# Patient Record
Sex: Female | Born: 1965 | Race: White | Hispanic: Yes | Marital: Married | Smoking: Never smoker
Health system: Southern US, Community
[De-identification: ages and names within clinical notes are randomized; demographics above are authoritative.]

---

## 1998-11-29 HISTORY — PX: BUNIONECTOMY: SHX129

## 2002-03-23 ENCOUNTER — Ambulatory Visit (HOSPITAL_COMMUNITY): Admission: RE | Admit: 2002-03-23 | Discharge: 2002-03-23 | Payer: Self-pay | Admitting: Family Medicine

## 2002-03-23 ENCOUNTER — Encounter: Payer: Self-pay | Admitting: Family Medicine

## 2003-05-09 ENCOUNTER — Encounter: Admission: RE | Admit: 2003-05-09 | Discharge: 2003-05-09 | Payer: Self-pay | Admitting: Family Medicine

## 2003-05-09 ENCOUNTER — Encounter: Payer: Self-pay | Admitting: Family Medicine

## 2003-12-08 ENCOUNTER — Emergency Department (HOSPITAL_COMMUNITY): Admission: EM | Admit: 2003-12-08 | Discharge: 2003-12-08 | Payer: Self-pay | Admitting: Emergency Medicine

## 2003-12-08 IMAGING — US US ABDOMEN COMPLETE
1 series · 14 of 25 positions shown · non-contrast
Comparison: none

CLINICAL DATA: Abdominal pain.
 COMPLETE ABDOMINAL ULTRASOUND
 There is no evidence of gallstones or gallbladder wall thickening.  There is no evidence of biliary ductal dilatation.  The liver is within normal limits in echogenicity, and no focal parenchymal lesions are identified.  The visualized portion of the pancreas is unremarkable in appearance.  Common bile duct 2.6 mm.  
 The kidneys are within normal limits in size and echogenicity, and there is no evidence of masses or hydronephrosis. Right kidney 10.3 cm.  Left kidney 10.3 cm.    There is no evidence of splenomegaly, ascites, or abdominal aortic aneurysm.  
 IMPRESSION
 Negative abdominal ultrasound.

[Series 1: us abdomen · 0.27mm/px · 14 of 68 slices shown]
[im 1/68]
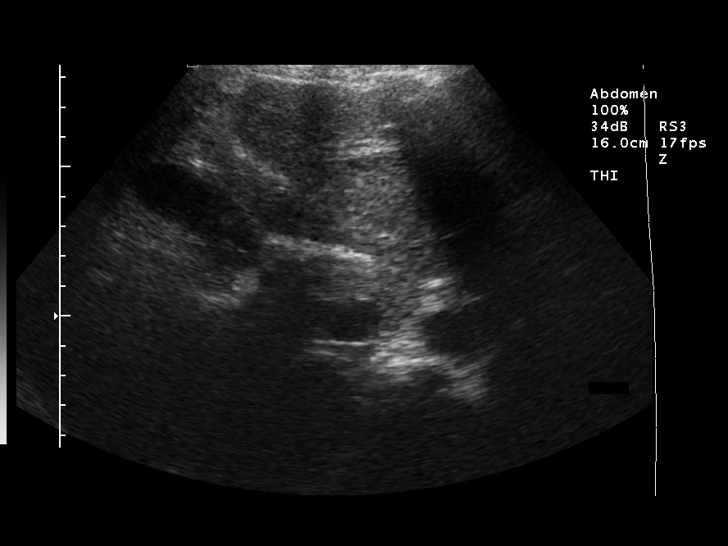
[im 6/68]
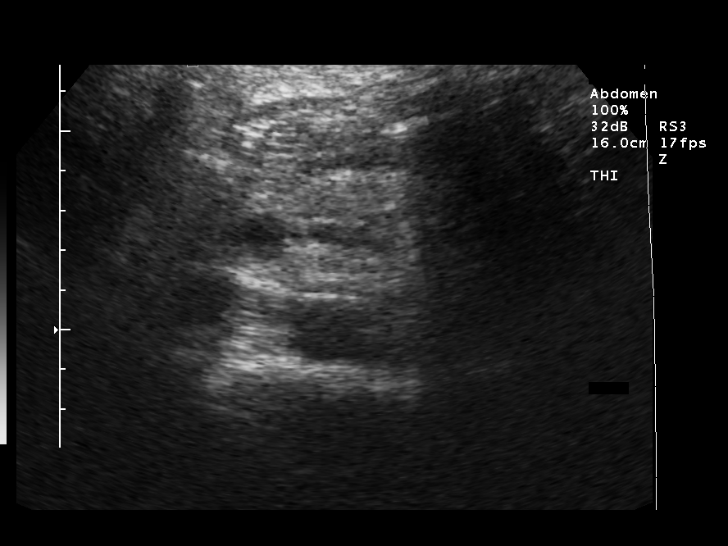
[im 12/68]
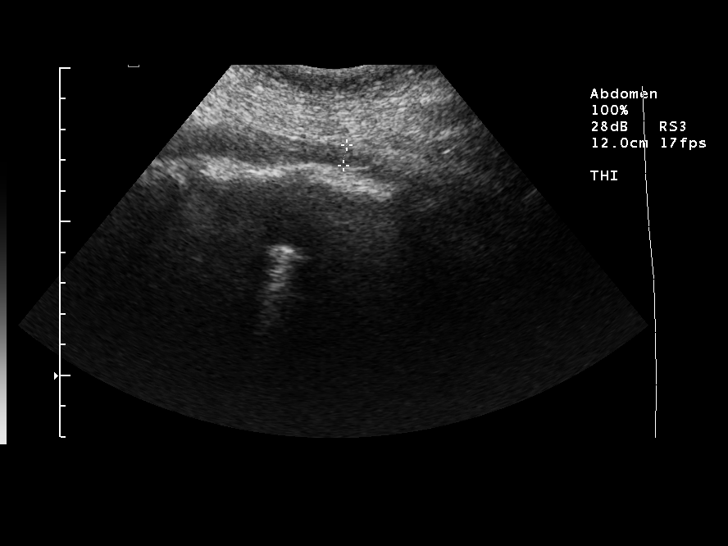
[im 17/68]
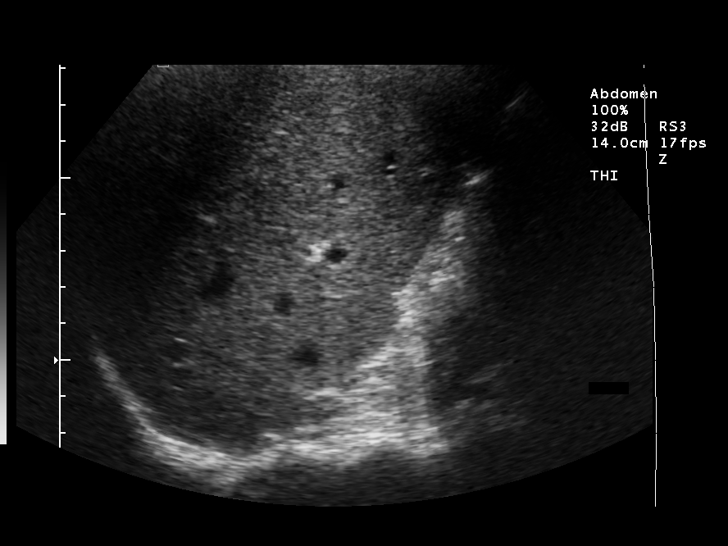
[im 23/68]
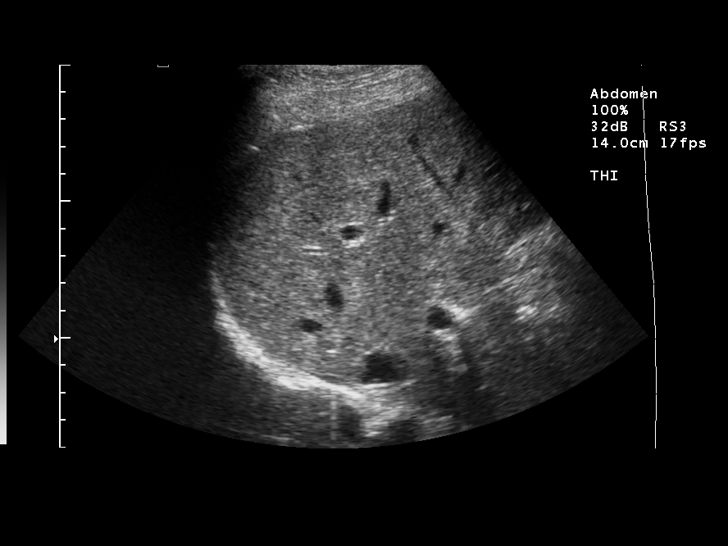
[im 26/68]
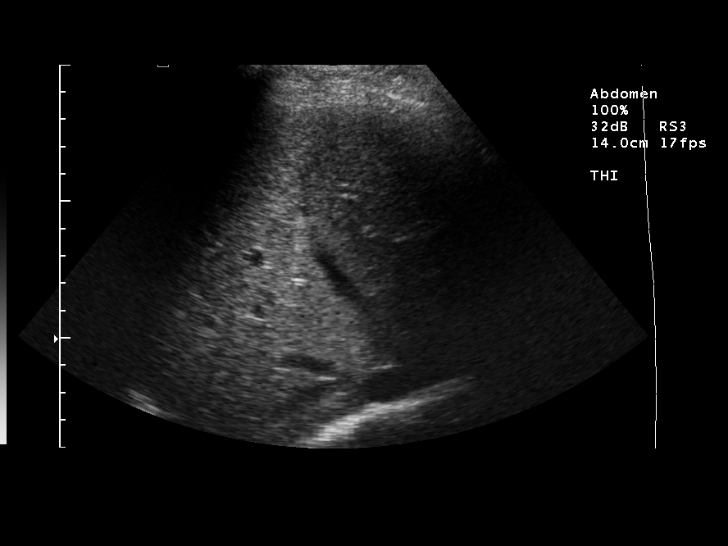
[im 31/68]
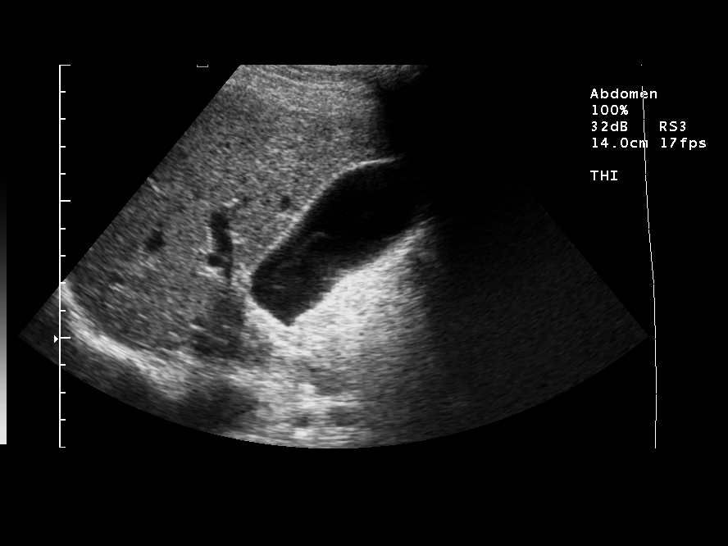
[im 37/68]
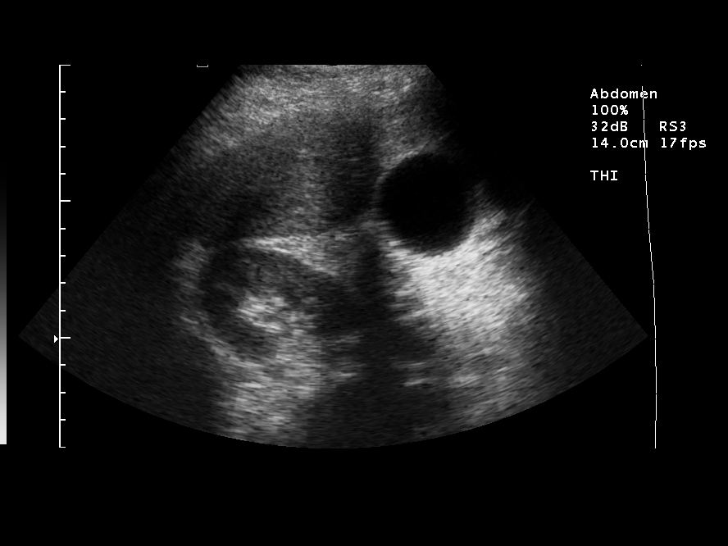
[im 42/68]
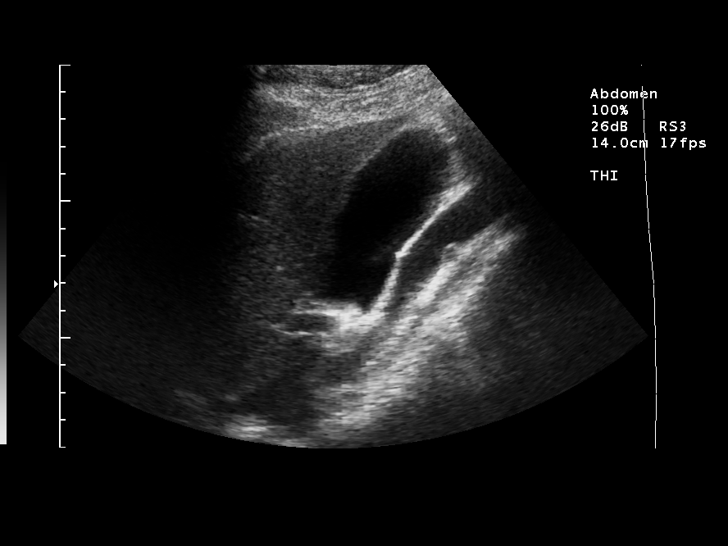
[im 45/68]
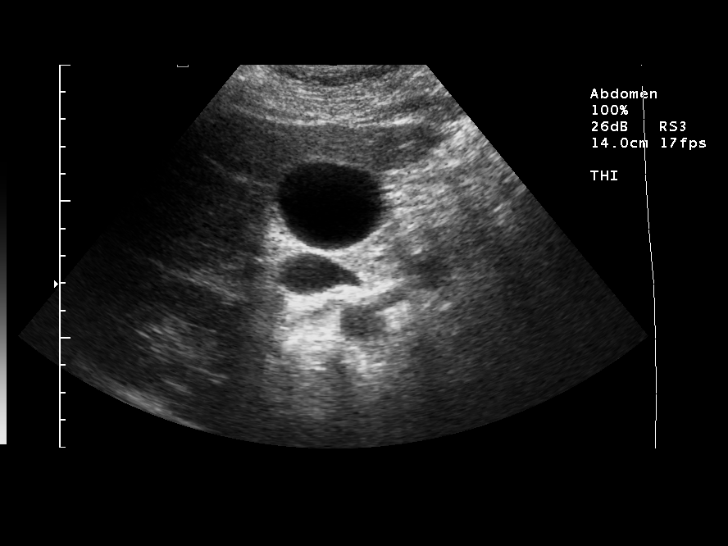
[im 51/68]
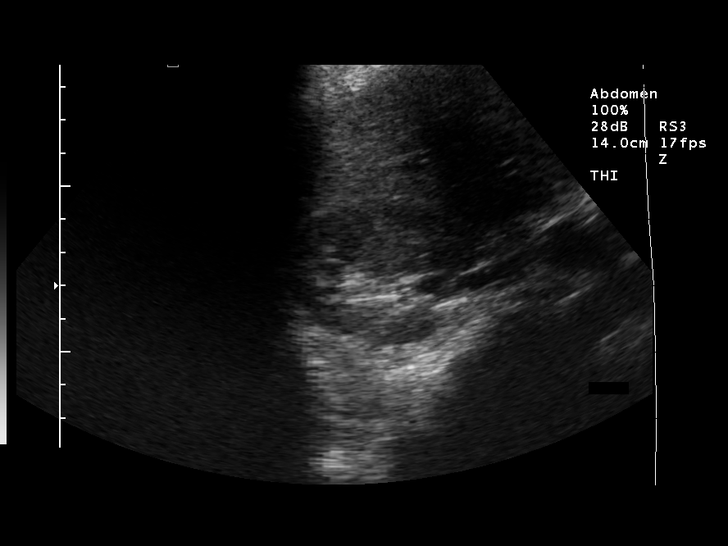
[im 56/68]
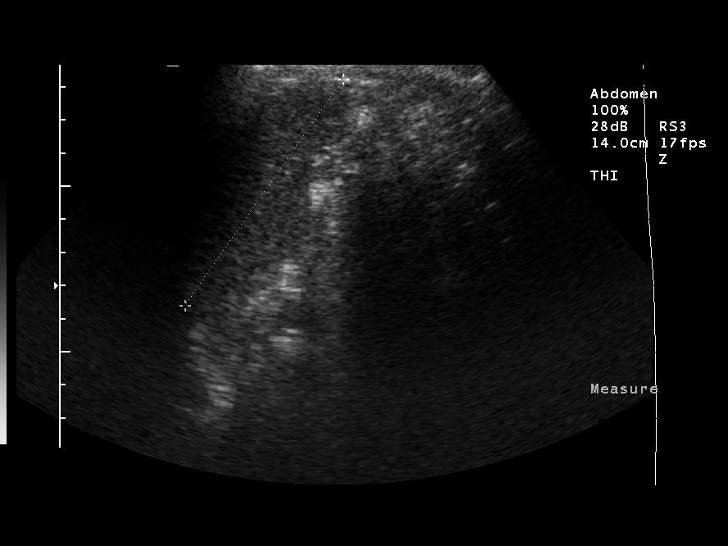
[im 62/68]
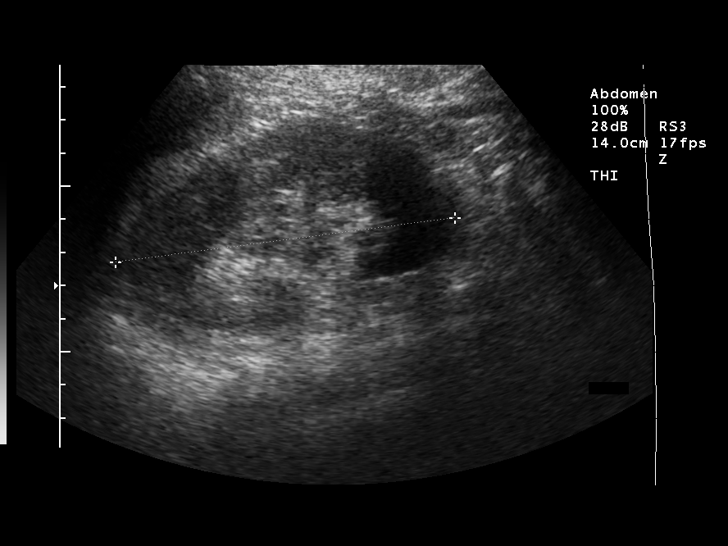
[im 68/68]
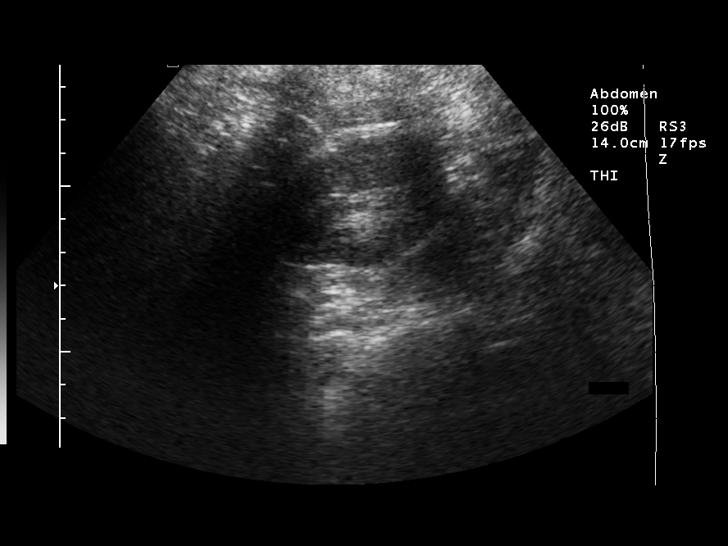

[14 of 25 positions shown; findings below may reference images not displayed]

## 2004-09-15 ENCOUNTER — Other Ambulatory Visit: Admission: RE | Admit: 2004-09-15 | Discharge: 2004-09-15 | Payer: Self-pay | Admitting: Family Medicine

## 2005-09-03 ENCOUNTER — Encounter: Admission: RE | Admit: 2005-09-03 | Discharge: 2005-09-03 | Payer: Self-pay | Admitting: Family Medicine

## 2005-09-03 IMAGING — CR DG SKULL COMPLETE 4+V
4 series · 4 of 4 positions shown · non-contrast
Comparison: None.

CLINICAL DATA: Pain in the right parietal area.  No history of injury.
 SKULL ? 4 VIEWS:

[[person_name]]
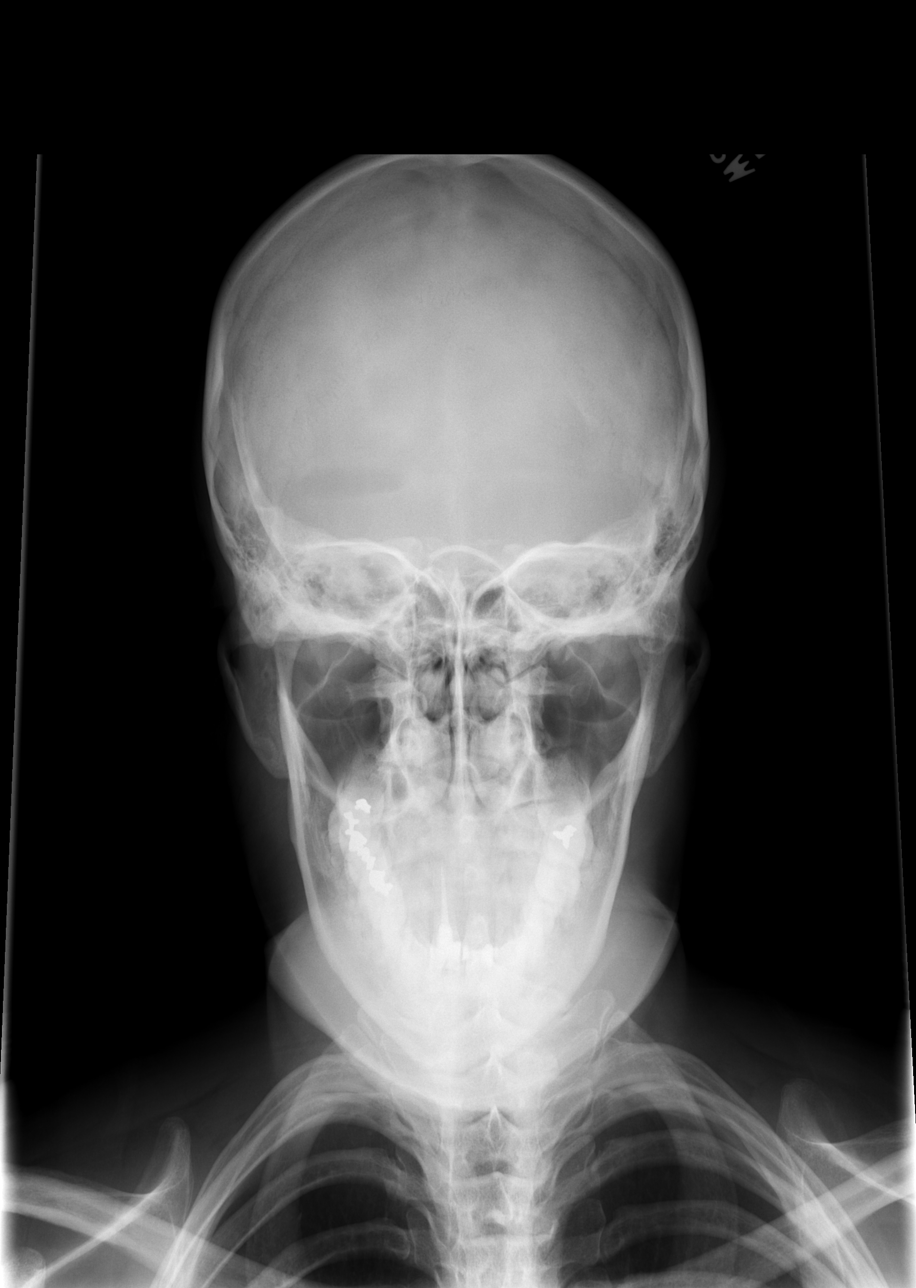

[w skull a.p./p.a.]
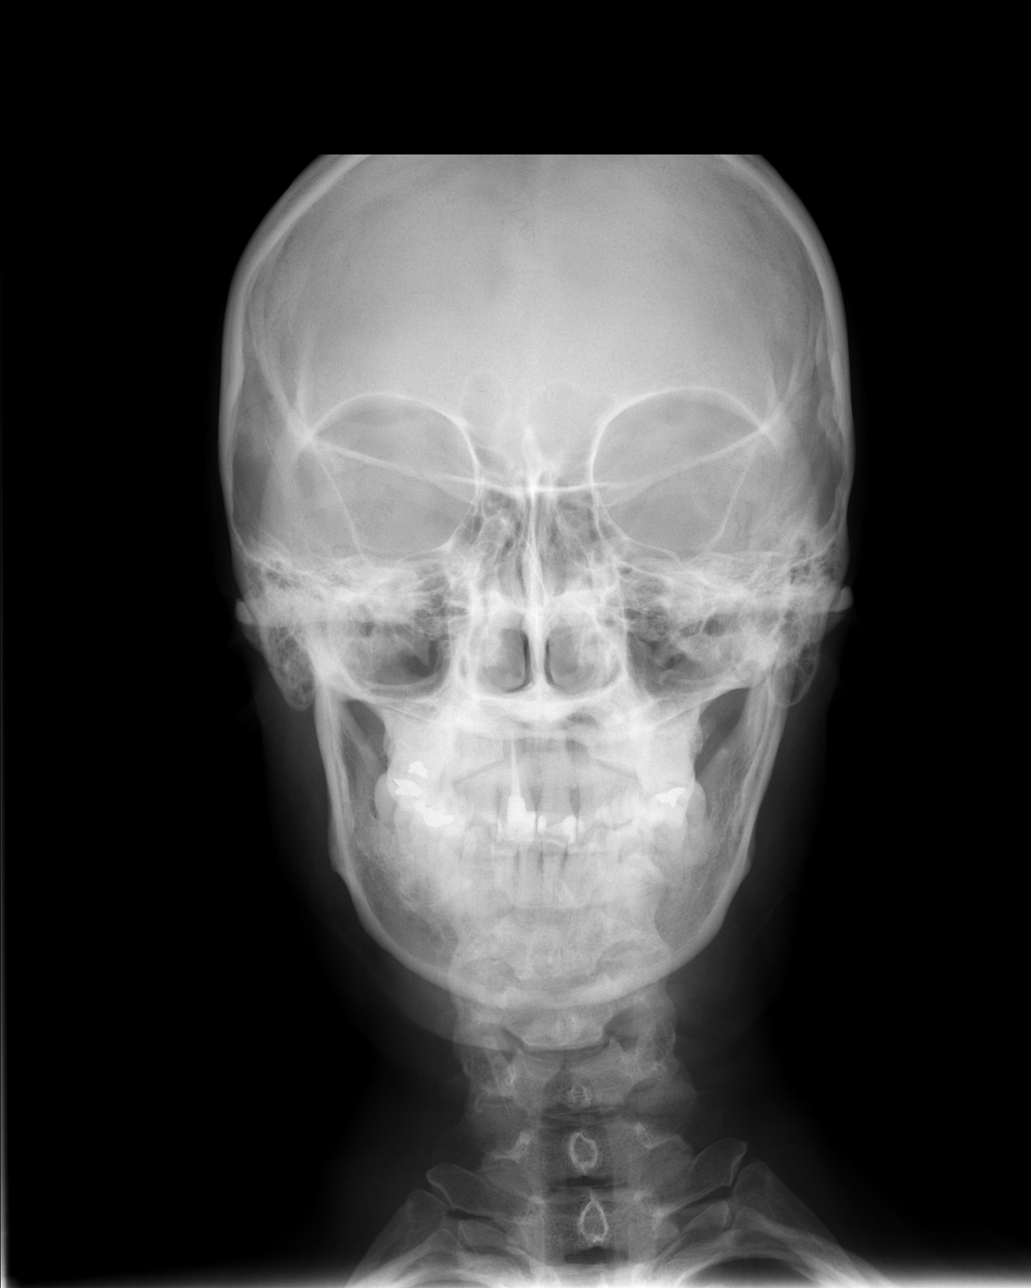

[w skull lat (1 of 2)]
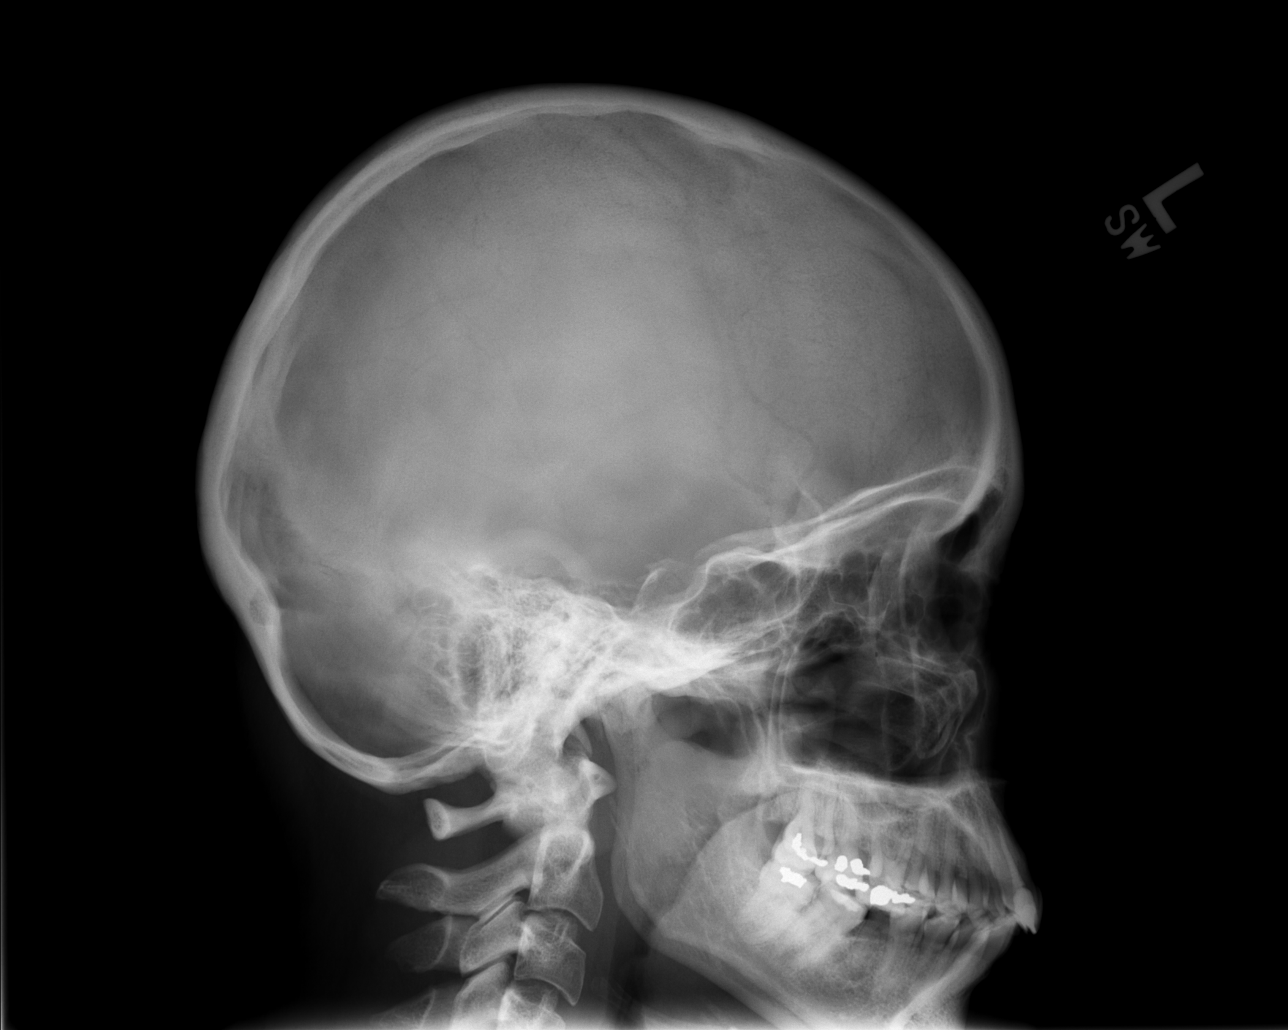

[w skull lat (2 of 2)]
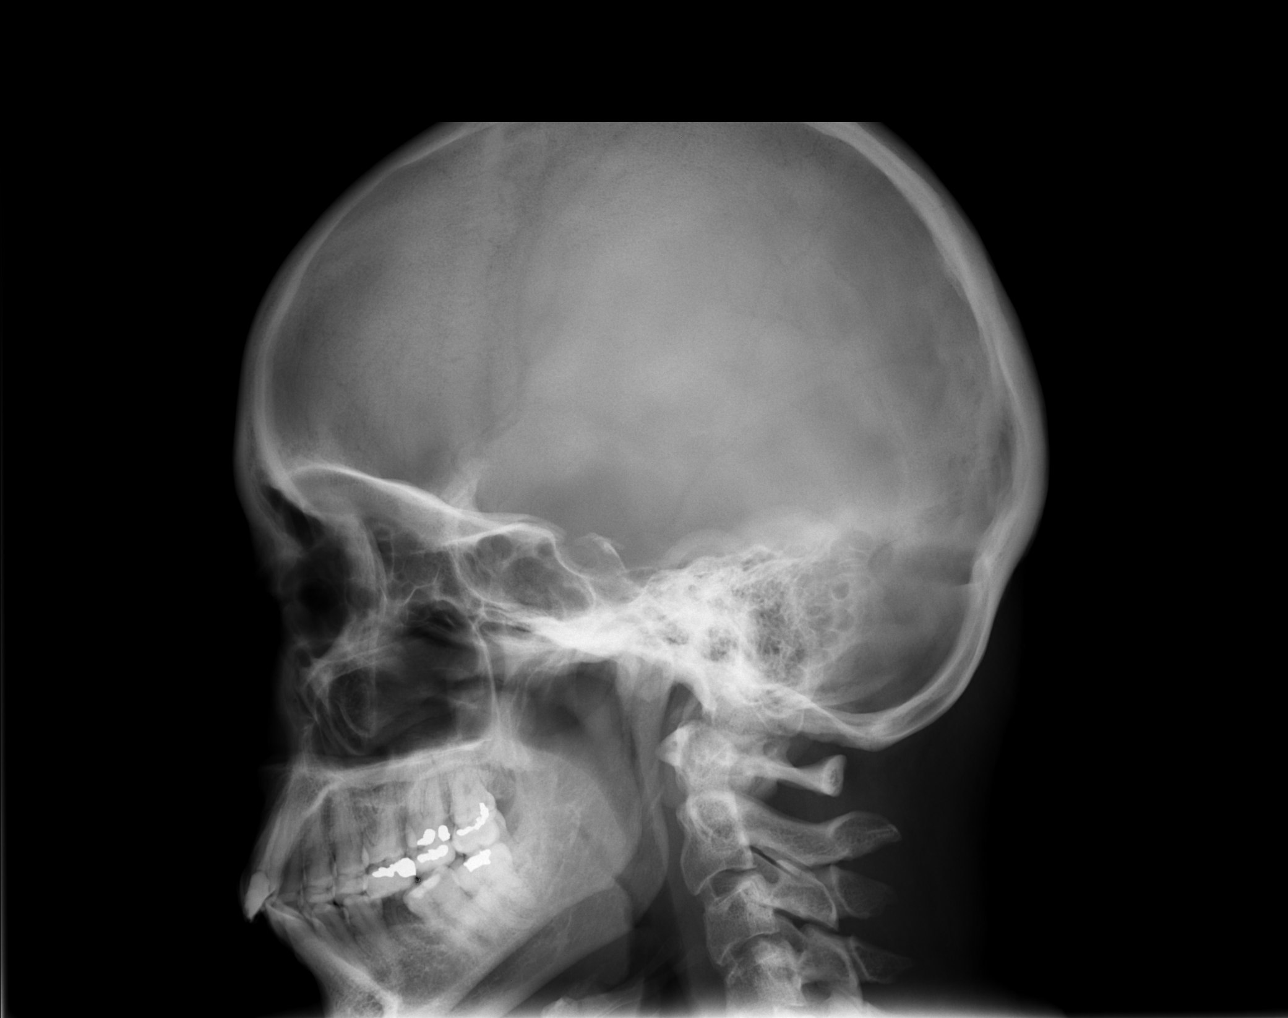

[4 of 4 positions shown; findings below may reference images not displayed]

FINDINGS: No calvarial defects are identified.  The paranasal sinuses appear to be normally aerated.
IMPRESSION: 1.  Normal skull.
 2.  If the patient?s clinical symptoms do not improve a CT or MRI may be considered for more definitive evaluation.

## 2006-09-16 ENCOUNTER — Other Ambulatory Visit: Admission: RE | Admit: 2006-09-16 | Discharge: 2006-09-16 | Payer: Self-pay | Admitting: Family Medicine

## 2006-10-07 ENCOUNTER — Ambulatory Visit (HOSPITAL_COMMUNITY): Admission: RE | Admit: 2006-10-07 | Discharge: 2006-10-07 | Payer: Self-pay | Admitting: Family Medicine

## 2013-02-16 ENCOUNTER — Other Ambulatory Visit: Payer: Self-pay | Admitting: Family Medicine

## 2013-02-16 ENCOUNTER — Other Ambulatory Visit (HOSPITAL_COMMUNITY)
Admission: RE | Admit: 2013-02-16 | Discharge: 2013-02-16 | Disposition: A | Discharge status: Home / Self Care | Payer: Managed Care, Other (non HMO) | Source: Ambulatory Visit | Attending: Family Medicine | Admitting: Family Medicine

## 2013-02-16 DIAGNOSIS — Z124 Encounter for screening for malignant neoplasm of cervix: Secondary | ICD-10-CM | POA: Insufficient documentation

## 2016-05-27 ENCOUNTER — Other Ambulatory Visit: Payer: Self-pay | Admitting: Family Medicine

## 2016-05-27 ENCOUNTER — Other Ambulatory Visit (HOSPITAL_COMMUNITY)
Admission: RE | Admit: 2016-05-27 | Discharge: 2016-05-27 | Disposition: A | Discharge status: Home / Self Care | Payer: Managed Care, Other (non HMO) | Source: Ambulatory Visit | Attending: Family Medicine | Admitting: Family Medicine

## 2016-05-27 DIAGNOSIS — Z124 Encounter for screening for malignant neoplasm of cervix: Secondary | ICD-10-CM | POA: Diagnosis present

## 2016-05-31 LAB — CYTOLOGY - PAP

## 2018-05-26 ENCOUNTER — Other Ambulatory Visit: Payer: Self-pay | Admitting: Family Medicine

## 2018-05-26 DIAGNOSIS — R300 Dysuria: Secondary | ICD-10-CM | POA: Diagnosis not present

## 2018-05-26 DIAGNOSIS — R198 Other specified symptoms and signs involving the digestive system and abdomen: Secondary | ICD-10-CM

## 2018-05-26 DIAGNOSIS — N644 Mastodynia: Secondary | ICD-10-CM | POA: Diagnosis not present

## 2018-06-02 ENCOUNTER — Ambulatory Visit
Admission: RE | Admit: 2018-06-02 | Discharge: 2018-06-02 | Disposition: A | Discharge status: Home / Self Care | Payer: BLUE CROSS/BLUE SHIELD | Source: Ambulatory Visit | Attending: Family Medicine | Admitting: Family Medicine

## 2018-06-02 DIAGNOSIS — R198 Other specified symptoms and signs involving the digestive system and abdomen: Secondary | ICD-10-CM

## 2018-06-02 DIAGNOSIS — K760 Fatty (change of) liver, not elsewhere classified: Secondary | ICD-10-CM | POA: Diagnosis not present

## 2018-06-02 IMAGING — CT CT ABD-PELV W/ CM
1 of 3 series · 13 of 32 positions shown, 19 images · IV contrast (iopamidol)
Comparison: None.

CLINICAL DATA: Umbilical discharge with menstrual cycle for several
months.

EXAM:
CT ABDOMEN AND PELVIS WITH CONTRAST
TECHNIQUE: Multidetector CT imaging of the abdomen and pelvis was performed
using the standard protocol following bolus administration of
intravenous contrast.
CONTRAST:  100mL [id number] IOPAMIDOL ([id number]) INJECTION 61%

[Series 2: abd/pelvis w/cm · axial · 0.77mm/px · z∈[-427,-7]mm · 13 of 98 slices shown, 19 images]
[im 7/98  soft-tissue]
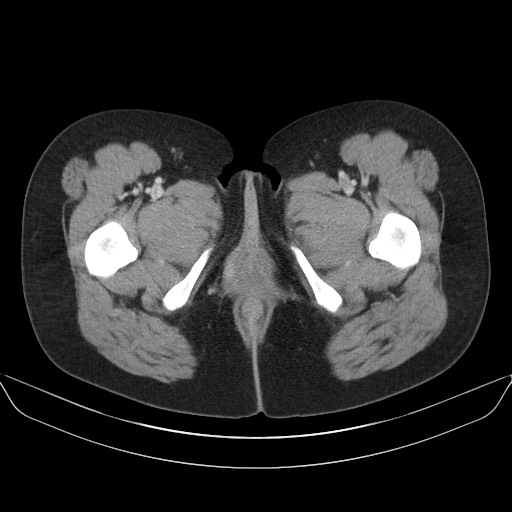
[im 7/98  bone]
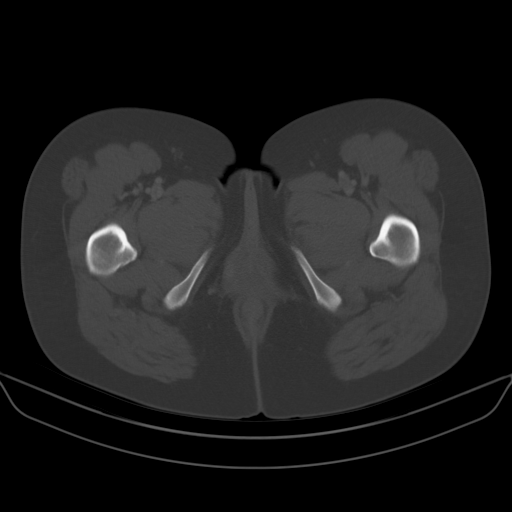
[im 14/98  soft-tissue]
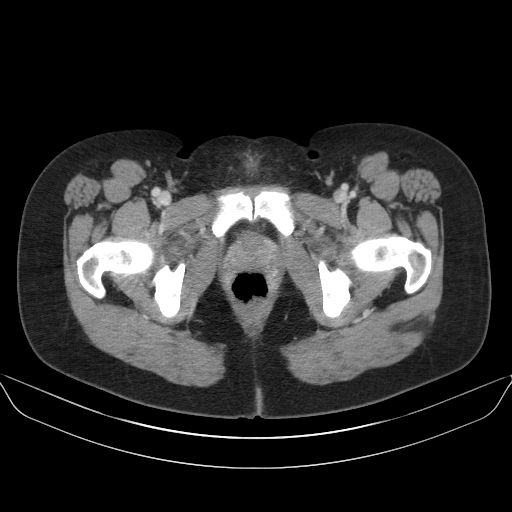
[im 21/98  soft-tissue]
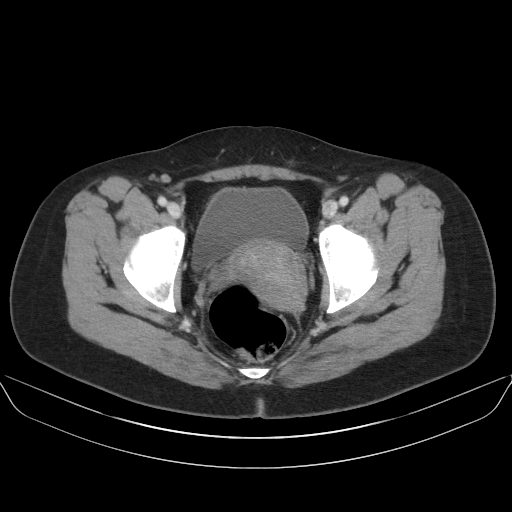
[im 28/98  soft-tissue]
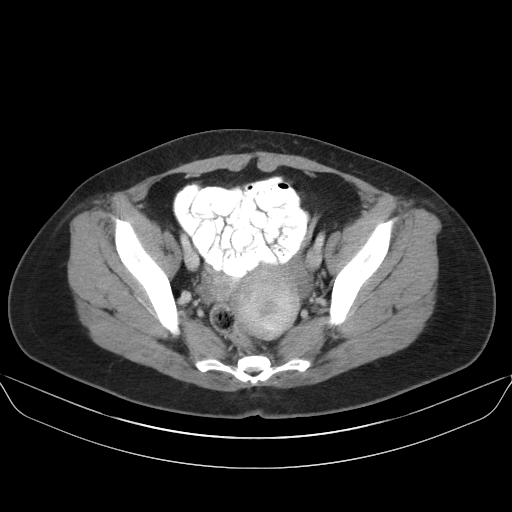
[im 35/98  soft-tissue]
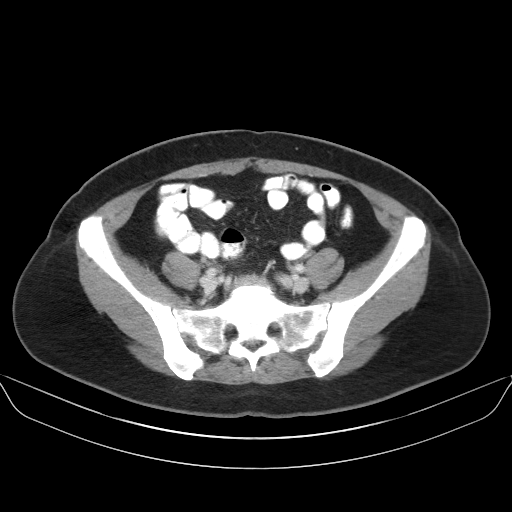
[im 42/98  soft-tissue]
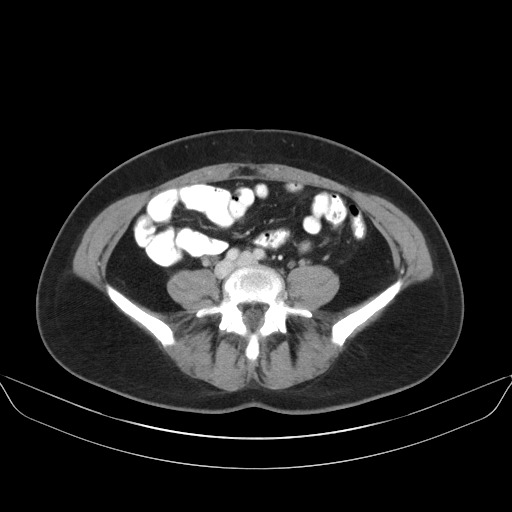
[im 49/98  soft-tissue]
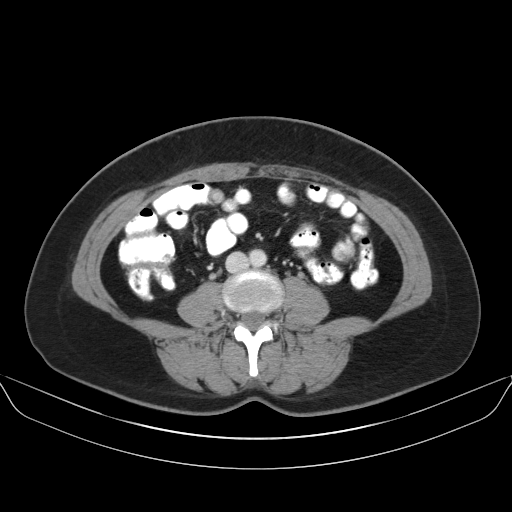
[im 56/98  soft-tissue]
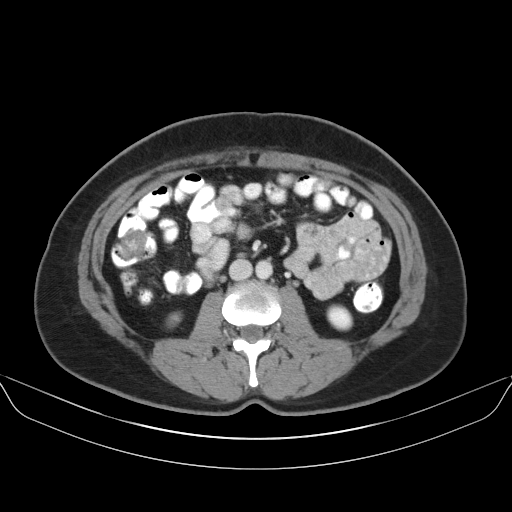
[im 63/98  soft-tissue]
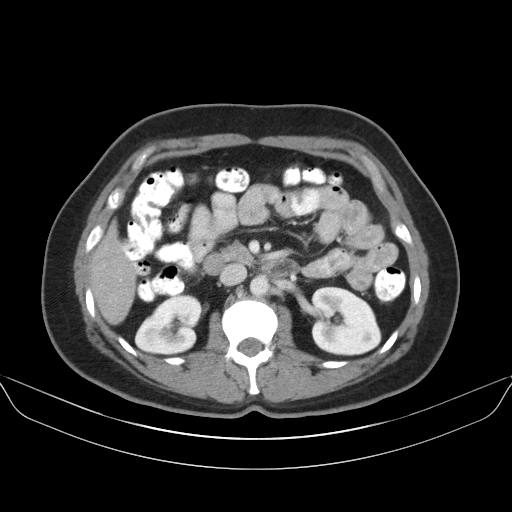
[im 63/98  bone]
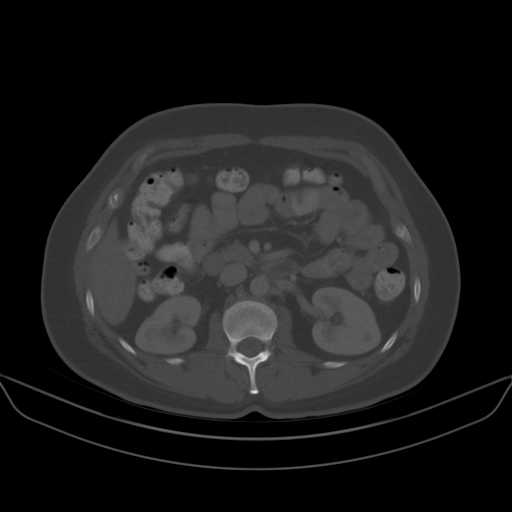
[im 70/98  soft-tissue]
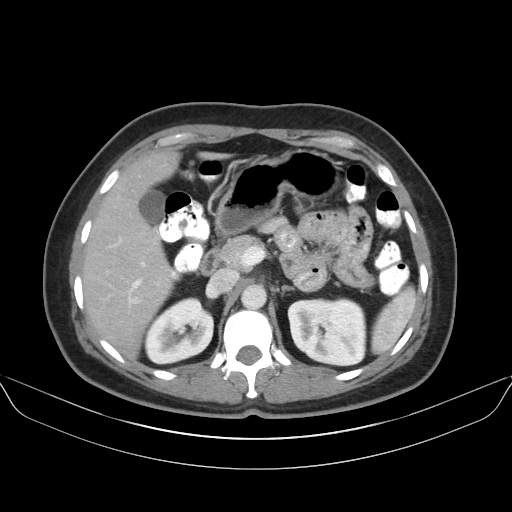
[im 70/98  lung]
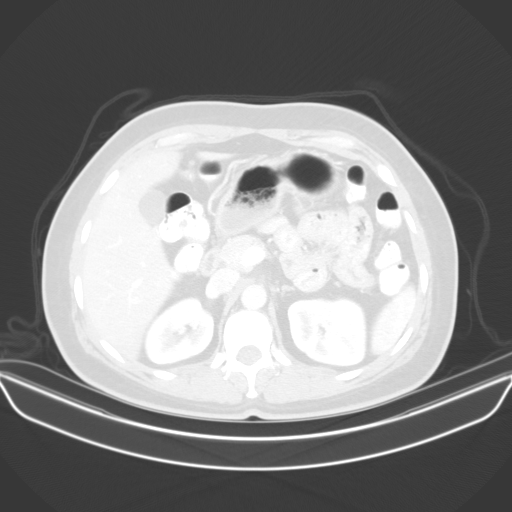
[im 77/98  soft-tissue]
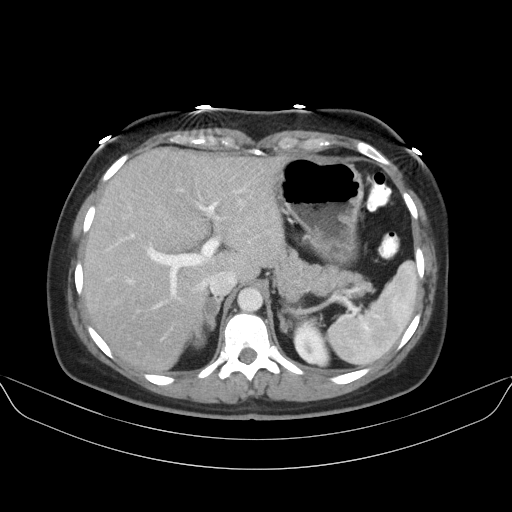
[im 77/98  lung]
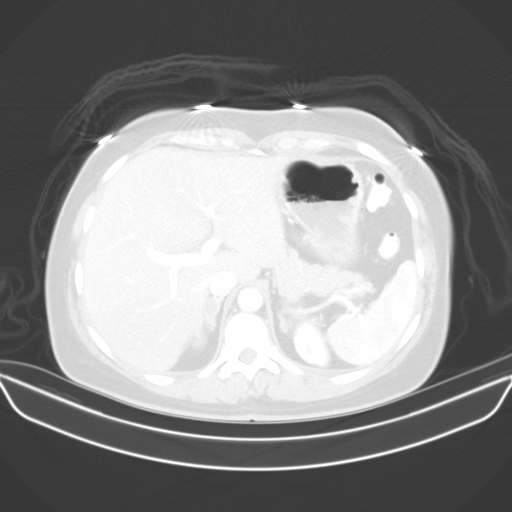
[im 84/98  soft-tissue]
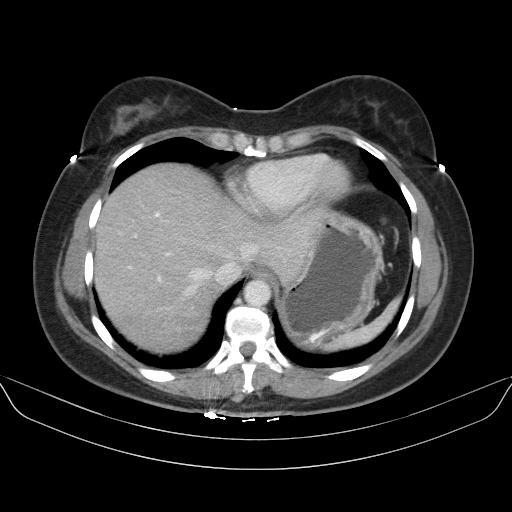
[im 84/98  lung]
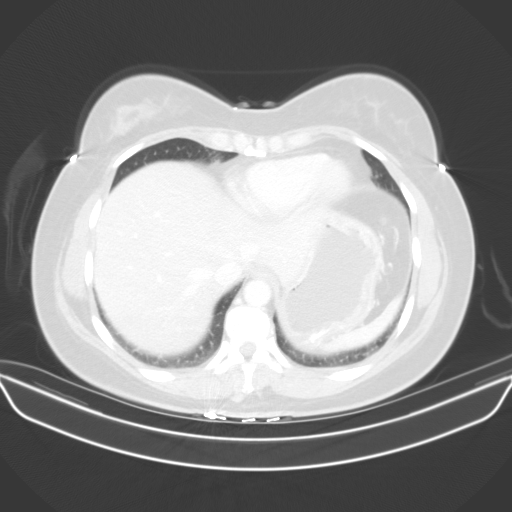
[im 91/98  soft-tissue]
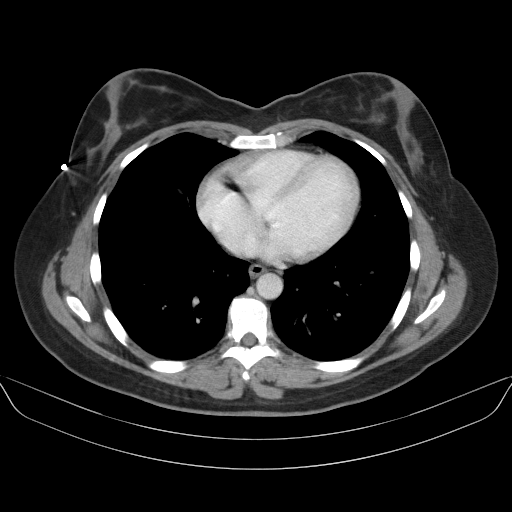
[im 91/98  lung]
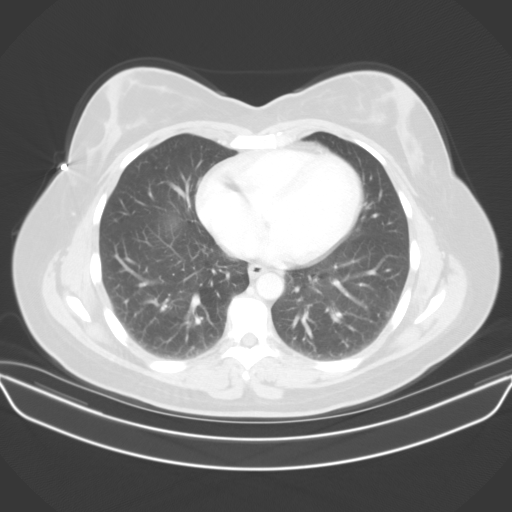

[13 of 32 positions shown; findings below may reference images not displayed]

FINDINGS: Lower chest: No acute abnormality.

Hepatobiliary: Hepatic steatosis. Portal vein and gallbladder are
normal.

Pancreas: Unremarkable. No pancreatic ductal dilatation or
surrounding inflammatory changes.

Spleen: Normal in size without focal abnormality.

Adrenals/Urinary Tract: The left adrenal gland is normal. A small
nodule is seen in the right adrenal gland measuring 13 by 10 mm. The
kidneys are normal. The ureters and bladder are normal.

Stomach/Bowel: The stomach and small bowel are normal. The colon is
normal. The appendix is normal.

Vascular/Lymphatic: No significant vascular findings are present. No
enlarged abdominal or pelvic lymph nodes.

Reproductive: Uterus and bilateral adnexa are unremarkable.

Other: There is mild increased attenuation in the fat of the
umbilicus. No fluid collection or fat stranding.

Musculoskeletal: No acute or significant osseous findings.
IMPRESSION: 1. There is mild increased attenuation in the fat of the umbilicus
with no adjacent fat stranding or fluid collection. This finding is
nonspecific but could represent scarring. No definitive cause for
the patient's symptoms identified.
2. Hepatic steatosis.
3. There is a small nodule in the right adrenal gland. This is
probably benign. Consider a 12 month follow-up CT scan to ensure
stability.

## 2018-06-02 MED ORDER — IOPAMIDOL (ISOVUE-300) INJECTION 61%
100.0000 mL | Freq: Once | INTRAVENOUS | Status: AC | PRN
  Administered 2018-06-02: 100 mL via INTRAVENOUS

## 2018-06-28 DIAGNOSIS — R1033 Periumbilical pain: Secondary | ICD-10-CM | POA: Diagnosis not present

## 2018-06-28 DIAGNOSIS — N644 Mastodynia: Secondary | ICD-10-CM | POA: Diagnosis not present

## 2018-07-06 DIAGNOSIS — N644 Mastodynia: Secondary | ICD-10-CM | POA: Diagnosis not present

## 2018-07-19 DIAGNOSIS — E7800 Pure hypercholesterolemia, unspecified: Secondary | ICD-10-CM | POA: Diagnosis not present

## 2018-07-19 DIAGNOSIS — Z0000 Encounter for general adult medical examination without abnormal findings: Secondary | ICD-10-CM | POA: Diagnosis not present

## 2018-08-22 DIAGNOSIS — Z23 Encounter for immunization: Secondary | ICD-10-CM | POA: Diagnosis not present

## 2019-07-13 DIAGNOSIS — Z1231 Encounter for screening mammogram for malignant neoplasm of breast: Secondary | ICD-10-CM | POA: Diagnosis not present

## 2019-08-31 ENCOUNTER — Other Ambulatory Visit: Payer: Self-pay | Admitting: Family Medicine

## 2019-08-31 ENCOUNTER — Other Ambulatory Visit (HOSPITAL_COMMUNITY)
Admission: RE | Admit: 2019-08-31 | Discharge: 2019-08-31 | Disposition: A | Discharge status: Home / Self Care | Payer: BC Managed Care – PPO | Source: Ambulatory Visit | Attending: Family Medicine | Admitting: Family Medicine

## 2019-08-31 DIAGNOSIS — Z01419 Encounter for gynecological examination (general) (routine) without abnormal findings: Secondary | ICD-10-CM | POA: Insufficient documentation

## 2019-08-31 DIAGNOSIS — Z0000 Encounter for general adult medical examination without abnormal findings: Secondary | ICD-10-CM | POA: Diagnosis not present

## 2019-08-31 DIAGNOSIS — E7800 Pure hypercholesterolemia, unspecified: Secondary | ICD-10-CM | POA: Diagnosis not present

## 2019-08-31 DIAGNOSIS — Z23 Encounter for immunization: Secondary | ICD-10-CM | POA: Diagnosis not present

## 2019-09-03 LAB — CYTOLOGY - PAP
Adequacy: ABSENT
Diagnosis: NEGATIVE

## 2019-09-06 DIAGNOSIS — Z1211 Encounter for screening for malignant neoplasm of colon: Secondary | ICD-10-CM | POA: Diagnosis not present

## 2019-09-13 ENCOUNTER — Other Ambulatory Visit: Payer: Self-pay

## 2019-09-13 DIAGNOSIS — Z20828 Contact with and (suspected) exposure to other viral communicable diseases: Secondary | ICD-10-CM | POA: Diagnosis not present

## 2019-09-13 DIAGNOSIS — Z20822 Contact with and (suspected) exposure to covid-19: Secondary | ICD-10-CM

## 2019-09-14 LAB — NOVEL CORONAVIRUS, NAA: SARS-CoV-2, NAA: NOT DETECTED

## 2019-11-30 DIAGNOSIS — Z780 Asymptomatic menopausal state: Secondary | ICD-10-CM

## 2019-11-30 HISTORY — DX: Asymptomatic menopausal state: Z78.0

## 2020-02-29 DIAGNOSIS — N3000 Acute cystitis without hematuria: Secondary | ICD-10-CM | POA: Diagnosis not present

## 2020-07-18 DIAGNOSIS — Z1231 Encounter for screening mammogram for malignant neoplasm of breast: Secondary | ICD-10-CM | POA: Diagnosis not present

## 2020-10-22 DIAGNOSIS — N760 Acute vaginitis: Secondary | ICD-10-CM | POA: Diagnosis not present

## 2020-10-22 DIAGNOSIS — Z23 Encounter for immunization: Secondary | ICD-10-CM | POA: Diagnosis not present

## 2020-10-22 DIAGNOSIS — Z0000 Encounter for general adult medical examination without abnormal findings: Secondary | ICD-10-CM | POA: Diagnosis not present

## 2020-10-22 DIAGNOSIS — E7800 Pure hypercholesterolemia, unspecified: Secondary | ICD-10-CM | POA: Diagnosis not present

## 2020-11-07 ENCOUNTER — Ambulatory Visit (INDEPENDENT_AMBULATORY_CARE_PROVIDER_SITE_OTHER): Payer: BC Managed Care – PPO

## 2020-11-07 ENCOUNTER — Other Ambulatory Visit: Payer: Self-pay | Admitting: Podiatry

## 2020-11-07 ENCOUNTER — Other Ambulatory Visit: Payer: Self-pay

## 2020-11-07 ENCOUNTER — Ambulatory Visit (INDEPENDENT_AMBULATORY_CARE_PROVIDER_SITE_OTHER): Payer: BC Managed Care – PPO | Admitting: Podiatry

## 2020-11-07 DIAGNOSIS — M79672 Pain in left foot: Secondary | ICD-10-CM

## 2020-11-07 DIAGNOSIS — R1033 Periumbilical pain: Secondary | ICD-10-CM | POA: Insufficient documentation

## 2020-11-07 DIAGNOSIS — N644 Mastodynia: Secondary | ICD-10-CM | POA: Insufficient documentation

## 2020-11-07 DIAGNOSIS — S99922A Unspecified injury of left foot, initial encounter: Secondary | ICD-10-CM | POA: Diagnosis not present

## 2020-11-07 DIAGNOSIS — M2041 Other hammer toe(s) (acquired), right foot: Secondary | ICD-10-CM

## 2020-11-07 DIAGNOSIS — M21961 Unspecified acquired deformity of right lower leg: Secondary | ICD-10-CM

## 2020-11-07 DIAGNOSIS — M21962 Unspecified acquired deformity of left lower leg: Secondary | ICD-10-CM

## 2020-11-07 DIAGNOSIS — S99921A Unspecified injury of right foot, initial encounter: Secondary | ICD-10-CM

## 2020-11-07 DIAGNOSIS — E7800 Pure hypercholesterolemia, unspecified: Secondary | ICD-10-CM | POA: Insufficient documentation

## 2020-11-07 DIAGNOSIS — M79671 Pain in right foot: Secondary | ICD-10-CM

## 2020-11-07 NOTE — Progress Notes (Signed)
  Subjective:  Patient ID: Elizabeth Sanchez, female    DOB: 07-Mar-1966,  MRN: [id number]  Chief Complaint  Patient presents with  . Toe Pain    Right 2nd digit painful 1 yr duration "I felt a pop and it has hurt since" pt states it is severely painful and worsening.    54 y.o. female presents with the above complaint. History confirmed with patient. Also has this to the left foot to a lesser extent. Has pain with all her shoes, and it has been hurting since she felt the pop.  Objective:  Physical Exam: warm, good capillary refill, no trophic changes or ulcerative lesions, normal DP and PT pulses and normal sensory exam. Left Foot: tenderness of the 2nd metatarsal head  Right Foot: tenderness of the 2nd metatarsal head, positive modified Lachman's test. Slight DF of 2nd toe. POP 3rd toe with hammertoe deformity.  No images are attached to the encounter.  Radiographs: X-ray of the right foot: well corrected bunion. Shortened 1st metatarsal with pin fixation. Elongated 2nd metatarsal. Corrected tailor's bunion. PIPJ arthrodesis of 2nd toe  X-ray of the left foot: well corrected bunion. Elongated 2nd metatarsal. Corrected tailor's bunion. PIPJ arthrodesis of 2nd toe Assessment:   1. Metatarsal deformity, right   2. Metatarsal deformity, left   3. Injury of plantar plate of right foot, initial encounter   4. Injury of plantar plate of left foot, initial encounter   5. Hammer toe of right foot    Plan:  Patient was evaluated and treated and all questions answered.  Capsulitis, Hammertoe and Metatarsalgia -XR reviewed with patient -Educated on etiology of deformity -Discussed padding and shoe gear changes  -Patient wishes to proceed with surgical intervention. All risks, benefits, and alternatives discussed with patient. No guarantees given. Consent reviewed and signed by patient. -Planned procedures: Right foot 2nd metatarsal shortening osteotomy, possible plantar plate repair. 3rd  hammertoe correction -Identified risk factors: none -DME dispensed for post-op use: [facility]   No follow-ups on file.

## 2020-12-18 DIAGNOSIS — N951 Menopausal and female climacteric states: Secondary | ICD-10-CM | POA: Diagnosis not present

## 2020-12-26 DIAGNOSIS — Z23 Encounter for immunization: Secondary | ICD-10-CM | POA: Diagnosis not present

## 2021-05-01 DIAGNOSIS — D123 Benign neoplasm of transverse colon: Secondary | ICD-10-CM | POA: Diagnosis not present

## 2021-05-01 DIAGNOSIS — K649 Unspecified hemorrhoids: Secondary | ICD-10-CM | POA: Diagnosis not present

## 2021-05-01 DIAGNOSIS — K644 Residual hemorrhoidal skin tags: Secondary | ICD-10-CM | POA: Diagnosis not present

## 2021-05-01 DIAGNOSIS — Z1211 Encounter for screening for malignant neoplasm of colon: Secondary | ICD-10-CM | POA: Diagnosis not present

## 2021-05-01 DIAGNOSIS — K635 Polyp of colon: Secondary | ICD-10-CM | POA: Diagnosis not present

## 2021-07-24 DIAGNOSIS — Z1231 Encounter for screening mammogram for malignant neoplasm of breast: Secondary | ICD-10-CM | POA: Diagnosis not present

## 2021-10-24 DIAGNOSIS — R42 Dizziness and giddiness: Secondary | ICD-10-CM | POA: Diagnosis not present

## 2021-10-24 DIAGNOSIS — H6982 Other specified disorders of Eustachian tube, left ear: Secondary | ICD-10-CM | POA: Diagnosis not present

## 2021-10-24 DIAGNOSIS — R112 Nausea with vomiting, unspecified: Secondary | ICD-10-CM | POA: Diagnosis not present

## 2021-12-03 DIAGNOSIS — E7800 Pure hypercholesterolemia, unspecified: Secondary | ICD-10-CM | POA: Diagnosis not present

## 2021-12-03 DIAGNOSIS — N951 Menopausal and female climacteric states: Secondary | ICD-10-CM | POA: Diagnosis not present

## 2021-12-03 DIAGNOSIS — Z0000 Encounter for general adult medical examination without abnormal findings: Secondary | ICD-10-CM | POA: Diagnosis not present

## 2021-12-03 DIAGNOSIS — Z23 Encounter for immunization: Secondary | ICD-10-CM | POA: Diagnosis not present

## 2021-12-03 DIAGNOSIS — R1033 Periumbilical pain: Secondary | ICD-10-CM | POA: Diagnosis not present

## 2021-12-04 DIAGNOSIS — R202 Paresthesia of skin: Secondary | ICD-10-CM | POA: Diagnosis not present

## 2021-12-04 DIAGNOSIS — M25512 Pain in left shoulder: Secondary | ICD-10-CM | POA: Diagnosis not present

## 2022-03-25 DIAGNOSIS — H02834 Dermatochalasis of left upper eyelid: Secondary | ICD-10-CM | POA: Diagnosis not present

## 2022-03-25 DIAGNOSIS — H0279 Other degenerative disorders of eyelid and periocular area: Secondary | ICD-10-CM | POA: Diagnosis not present

## 2022-03-25 DIAGNOSIS — H02413 Mechanical ptosis of bilateral eyelids: Secondary | ICD-10-CM | POA: Diagnosis not present

## 2022-03-25 DIAGNOSIS — H02831 Dermatochalasis of right upper eyelid: Secondary | ICD-10-CM | POA: Diagnosis not present

## 2022-03-25 DIAGNOSIS — H02423 Myogenic ptosis of bilateral eyelids: Secondary | ICD-10-CM | POA: Diagnosis not present

## 2022-04-05 DIAGNOSIS — H53483 Generalized contraction of visual field, bilateral: Secondary | ICD-10-CM | POA: Diagnosis not present

## 2022-06-04 DIAGNOSIS — H2513 Age-related nuclear cataract, bilateral: Secondary | ICD-10-CM | POA: Diagnosis not present

## 2022-06-04 DIAGNOSIS — H02839 Dermatochalasis of unspecified eye, unspecified eyelid: Secondary | ICD-10-CM | POA: Diagnosis not present

## 2022-06-04 DIAGNOSIS — D23121 Other benign neoplasm of skin of left upper eyelid, including canthus: Secondary | ICD-10-CM | POA: Diagnosis not present

## 2022-06-04 DIAGNOSIS — H35373 Puckering of macula, bilateral: Secondary | ICD-10-CM | POA: Diagnosis not present

## 2022-06-04 DIAGNOSIS — H02403 Unspecified ptosis of bilateral eyelids: Secondary | ICD-10-CM | POA: Diagnosis not present

## 2022-07-08 DIAGNOSIS — D23112 Other benign neoplasm of skin of right lower eyelid, including canthus: Secondary | ICD-10-CM | POA: Diagnosis not present

## 2022-07-08 DIAGNOSIS — H02839 Dermatochalasis of unspecified eye, unspecified eyelid: Secondary | ICD-10-CM | POA: Diagnosis not present

## 2022-07-08 DIAGNOSIS — H02403 Unspecified ptosis of bilateral eyelids: Secondary | ICD-10-CM | POA: Diagnosis not present

## 2022-07-08 DIAGNOSIS — D23122 Other benign neoplasm of skin of left lower eyelid, including canthus: Secondary | ICD-10-CM | POA: Diagnosis not present

## 2022-07-16 DIAGNOSIS — H11229 Conjunctival granuloma, unspecified: Secondary | ICD-10-CM | POA: Diagnosis not present

## 2022-07-16 DIAGNOSIS — D23122 Other benign neoplasm of skin of left lower eyelid, including canthus: Secondary | ICD-10-CM | POA: Diagnosis not present

## 2022-07-16 DIAGNOSIS — D23112 Other benign neoplasm of skin of right lower eyelid, including canthus: Secondary | ICD-10-CM | POA: Diagnosis not present

## 2022-08-05 DIAGNOSIS — H35373 Puckering of macula, bilateral: Secondary | ICD-10-CM | POA: Diagnosis not present

## 2022-08-05 DIAGNOSIS — D23122 Other benign neoplasm of skin of left lower eyelid, including canthus: Secondary | ICD-10-CM | POA: Diagnosis not present

## 2022-08-05 DIAGNOSIS — H2513 Age-related nuclear cataract, bilateral: Secondary | ICD-10-CM | POA: Diagnosis not present

## 2022-08-05 DIAGNOSIS — H02839 Dermatochalasis of unspecified eye, unspecified eyelid: Secondary | ICD-10-CM | POA: Diagnosis not present

## 2022-08-06 DIAGNOSIS — Z1231 Encounter for screening mammogram for malignant neoplasm of breast: Secondary | ICD-10-CM | POA: Diagnosis not present

## 2022-10-14 DIAGNOSIS — Z03818 Encounter for observation for suspected exposure to other biological agents ruled out: Secondary | ICD-10-CM | POA: Diagnosis not present

## 2022-10-14 DIAGNOSIS — J029 Acute pharyngitis, unspecified: Secondary | ICD-10-CM | POA: Diagnosis not present

## 2022-10-14 DIAGNOSIS — R059 Cough, unspecified: Secondary | ICD-10-CM | POA: Diagnosis not present

## 2022-12-16 DIAGNOSIS — E7800 Pure hypercholesterolemia, unspecified: Secondary | ICD-10-CM | POA: Diagnosis not present

## 2022-12-16 DIAGNOSIS — N951 Menopausal and female climacteric states: Secondary | ICD-10-CM | POA: Diagnosis not present

## 2022-12-16 DIAGNOSIS — R1033 Periumbilical pain: Secondary | ICD-10-CM | POA: Diagnosis not present

## 2022-12-16 DIAGNOSIS — Z0000 Encounter for general adult medical examination without abnormal findings: Secondary | ICD-10-CM | POA: Diagnosis not present

## 2022-12-17 DIAGNOSIS — Z1211 Encounter for screening for malignant neoplasm of colon: Secondary | ICD-10-CM | POA: Diagnosis not present

## 2023-03-09 ENCOUNTER — Ambulatory Visit (INDEPENDENT_AMBULATORY_CARE_PROVIDER_SITE_OTHER): Payer: Self-pay | Admitting: Dermatology

## 2023-03-09 ENCOUNTER — Encounter: Payer: Self-pay | Admitting: Dermatology

## 2023-03-09 VITALS — BP 117/76

## 2023-03-09 DIAGNOSIS — L649 Androgenic alopecia, unspecified: Secondary | ICD-10-CM

## 2023-03-09 NOTE — Patient Instructions (Addendum)
Due to recent changes in healthcare laws, you may see results of your pathology and/or laboratory studies on MyChart before the doctors have had a chance to review them. We understand that in some cases there may be results that are confusing or concerning to you. Please understand that not all results are received at the same time and often the doctors may need to interpret multiple results in order to provide you with the best plan of care or course of treatment. Therefore, we ask that you please give us 2 business days to thoroughly review all your results before contacting the office for clarification. Should we see a critical lab result, you will be contacted sooner.   If You Need Anything After Your Visit  If you have any questions or concerns for your doctor, please call our main line at [phone] If no one answers, please leave a voicemail as directed and we will return your call as soon as possible. Messages left after 4 pm will be answered the following business day.   You may also send us a message via MyChart. We typically respond to MyChart messages within 1-2 business days.  For prescription refills, please ask your pharmacy to contact our office. Our fax number is [phone].  If you have an urgent issue when the clinic is closed that cannot wait until the next business day, you can page your doctor at the number below.    Please note that while we do our best to be available for urgent issues outside of office hours, we are not available 24/7.   If you have an urgent issue and are unable to reach us, you may choose to seek medical care at your doctor's office, retail clinic, urgent care center, or emergency room.  If you have a medical emergency, please immediately call 911 or go to the emergency department. In the event of inclement weather, please call our main line at [phone] for an update on the status of any delays or closures.  Dermatology Medication Tips: Please  keep the boxes that topical medications come in in order to help keep track of the instructions about where and how to use these. Pharmacies typically print the medication instructions only on the boxes and not directly on the medication tubes.   If your medication is too expensive, please contact our office at [phone] or send us a message through MyChart.   We are unable to tell what your co-pay for medications will be in advance as this is different depending on your insurance coverage. However, we may be able to find a substitute medication at lower cost or fill out paperwork to get insurance to cover a needed medication.   If a prior authorization is required to get your medication covered by your insurance company, please allow us 1-2 business days to complete this process.  Drug prices often vary depending on where the prescription is filled and some pharmacies may offer cheaper prices.  The website www.goodrx.com contains coupons for medications through different pharmacies. The prices here do not account for what the cost may be with help from insurance (it may be cheaper with your insurance), but the website can give you the price if you did not use any insurance.  - You can print the associated coupon and take it with your prescription to the pharmacy.  - You may also stop by our office during regular business hours and pick up a GoodRx coupon card.  - If you need your   prescription sent electronically to a different pharmacy, notify our office through [facility] MyChart or by phone at [phone]     

## 2023-03-09 NOTE — Progress Notes (Signed)
   New Patient Visit   Subjective  Elizabeth Sanchez is a 57 y.o. female who presents for the following: General widespread hair loss on scalp with occasional itching x 1 year. She went into menopause 3 years ago. Past treatment includes: 1 bottle of Rogaine solution with  no improvement, Rosemary mint oil which caused itching, Nioxin Shampoo, Mielle Rosemary mint shampoo. She is currently taking a Collagen/Biotin supplement daily and using Biolage shampoo.   Objective  Well appearing patient in no apparent distress; mood and affect are within normal limits.   A focused examination was performed of the following areas:    Relevant exam findings are noted in the Assessment and Plan.    Assessment & Plan   ANDROGENETIC ALOPECIA (FEMALE PATTERN HAIR LOSS) Exam: Diffuse thinning of the crown and widening of the midline part with retention of the frontal hairline    Female Androgenic Alopecia is a chronic condition related to genetics and/or hormonal changes.  In women androgenetic alopecia is commonly associated with menopause but may occur any time after puberty.  It causes hair thinning primarily on the crown with widening of the part and temporal hairline recession.  Can use OTC Rogaine (minoxidil) 5% solution/foam as directed.  Oral treatments in female patients who have no contraindication may include : - Low dose oral minoxidil 1.25 - 5mg  daily - Spironolactone 50 - 100mg  bid - Finasteride 2.5 - 5 mg daily Adjunctive therapies include: - Low Level Laser Light Therapy (LLLT) - Platelet-rich plasma injections (PRP) - Hair Transplants or scalp reduction   Treatment Plan:  -Minoxidil hair stim hair solution from [employer] [facility]  -Viviscal daily -Collagen powder  Patient Counseling -Explained to patient that this is caused by genetic predisposition and hormonal changes -We cannot change genetics to "cure" this condition but we can treat with a combination of topical and  sometimes oral medications -Treatment is most effective at preventing additional hair loss and we cannot predict exactly how much hair already lost will grow back - First line topical tx's include: topical minoxidil, finasteride, spironolactone, or dutasteride -Oradailyl teatments include: oral minoxidil, oral finasteride, oral spironolactone -Other interventions: PRP, red light, hair transplant The following portions of the chart were reviewed this encounter and updated as appropriate: medications, allergies, medical history  Review of Systems:  No other skin or systemic complaints except as noted in HPI or Assessment and Plan.      Return in about 4 months (around 07/09/2023) for Hair loss.  Elizabeth Sanchez, CMA, am acting as scribe for Langston Reusing, MD.   Documentation: I have reviewed the above documentation for accuracy and completeness, and I agree with the above.  Langston Reusing, MD

## 2023-04-13 ENCOUNTER — Telehealth: Payer: Self-pay | Admitting: Dermatology

## 2023-04-13 ENCOUNTER — Encounter: Payer: Self-pay | Admitting: Dermatology

## 2023-04-13 NOTE — Telephone Encounter (Signed)
Patient called in requesting medical advise in regards to her medication she does not know if she need to buy both items that were listed to her or just one, Call back [phone]

## 2023-04-14 NOTE — Telephone Encounter (Signed)
LVM to return call.

## 2023-04-22 ENCOUNTER — Telehealth: Payer: Self-pay | Admitting: Dermatology

## 2023-04-22 NOTE — Telephone Encounter (Signed)
Patient called on Friday (04/22/23) with a question regarding her  prescription.

## 2023-05-30 ENCOUNTER — Telehealth: Payer: Self-pay

## 2023-05-30 NOTE — Telephone Encounter (Signed)
She called complaining of itching on her scalp. She has been using [employer] [facility] Hair Stim since 03/16/23. 2 weeks ago she started feeling itching. I suggested she stop using it for a week, try some Head and Shoulders or DHS Zinc Shampoo. I told her that from prior knowledge sometimes this reaction can occur and maybe her scalp just needs a break. If itching subsides she can slowly restart using the Hair Stim every other day. I suggested she call again if no improvement or if symptoms worsen.

## 2023-06-17 DIAGNOSIS — E7800 Pure hypercholesterolemia, unspecified: Secondary | ICD-10-CM | POA: Diagnosis not present

## 2023-07-11 ENCOUNTER — Encounter: Payer: Self-pay | Admitting: Dermatology

## 2023-07-11 ENCOUNTER — Ambulatory Visit: Payer: BC Managed Care – PPO | Admitting: Dermatology

## 2023-07-11 DIAGNOSIS — Z79899 Other long term (current) drug therapy: Secondary | ICD-10-CM

## 2023-07-11 DIAGNOSIS — L649 Androgenic alopecia, unspecified: Secondary | ICD-10-CM

## 2023-07-11 MED ORDER — SAFETY SEAL MISCELLANEOUS MISC: 1.0000 | 3 refills | Status: DC

## 2023-07-11 MED ORDER — SAFETY SEAL MISCELLANEOUS MISC: 1.0000 | 3 refills | Status: AC

## 2023-07-11 NOTE — Progress Notes (Signed)
   Follow-Up Visit   Subjective  Elizabeth Sanchez is a 57 y.o. female who presents for the following: Androgenetic Alopecia  Patient present today for follow up visit for Androgenetic Alopecia . Patient was last evaluated on 03/09/23. Patient reports sxs are not at goal. Patient reports no medication changes. She reports she was prescribed the [employer] scalp solution. She states the drops caused her to have excessive itching so she stopped using about 1 week ago.  She is also currently using Vivascal hair supplements and Vita Protein Hair powder.  The following portions of the chart were reviewed this encounter and updated as appropriate: medications, allergies, medical history  Review of Systems:  No other skin or systemic complaints except as noted in HPI or Assessment and Plan.  Objective  Well appearing patient in no apparent distress; mood and affect are within normal limits.  A focused examination was performed of the following areas: Scalp  Relevant exam findings are noted in the Assessment and Plan.  3 month Follow Up     Baseline   Assessment & Plan   ANDROGENETIC ALOPECIA (FEMALE PATTERN HAIR LOSS) --> Imporved Exam: Diffuse thinning of the crown and widening of the midline part with retention of the frontal hairline and crown   Treatment Plan: - We will discontinue the [facility] Hair solution due to excessive itching - We will send in AA Gel to [facility], instructed to apply in the morning to prevent the medication from rubbing on pillow and causing hair growth on unwanted areas - We also recommended starting DHS Zinc Shampoo to help aid scalp itching  Long term medication management.  Patient is using long term (months to years) prescription medication  to control their dermatologic condition.  These medications require periodic monitoring to evaluate for efficacy and side effects and may require periodic laboratory monitoring.    Androgenic alopecia    Return in about 3 months (around 10/11/2023) for Alopecia F/U.   Documentation: I have reviewed the above documentation for accuracy and completeness, and I agree with the above.  Stasia Cavalier, am acting as scribe for Langston Reusing, DO.  Langston Reusing, DO

## 2023-07-11 NOTE — Patient Instructions (Addendum)
Hello Elizabeth Sanchez,  Thank you for visiting [place] and discussing your concerns regarding your treatment. We appreciate your commitment to improving your health and are pleased to note the progress in your condition.  Here are the key instructions from our consultation regarding your hair loss:  - Medication Adjustments:   - Discontinue the use of the current drops if they continue to cause itching.   - Begin using minoxidil combined with clobetasol to potentially reduce irritation. Apply as directed.   - Consider using minoxidil with finasteride as an alternative option.   - Continue with oral supplements, collagen, and Viviscal as previously.  - Shampoo Recommendations:   - Use DHS Zinc shampoo to help with scalp inflammation. Let it sit for three minutes before rinsing. It can be mixed with your regular shampoo or used alone, followed by conditioner.  - Application Techniques:   - The new topical treatment is a gel. Apply every other morning to avoid facial hair growth. If applying at night, ensure it dries completely to prevent transfer to your pillow.   - Avoid applying the gel to a wet scalp.   - Massage the gel into the scalp rather than pouring it directly.  - Storage Instructions:   - Store Skin Medicinals in the refrigerator to extend its shelf life.  - Follow-Up:   - We will see you every three months to monitor your progress and make any necessary adjustments to your treatment plan.  - Pharmacy Coordination:   - We will provide information for [facility] pharmacy for your new prescriptions.  Please follow these instructions carefully and observe how your scalp responds to the new regimen. If you experience any adverse reactions such as burning or itching with the new treatments, please discontinue use and inform us immediately.  We look forward to seeing you in three months for your follow-up appointment. Keep up the great work, and do not hesitate to contact us if you have any  questions or concerns.  Warm regards,  Dr. Langston Reusing, Dermatology         Due to recent changes in healthcare laws, you may see results of your pathology and/or laboratory studies on MyChart before the doctors have had a chance to review them. We understand that in some cases there may be results that are confusing or concerning to you. Please understand that not all results are received at the same time and often the doctors may need to interpret multiple results in order to provide you with the best plan of care or course of treatment. Therefore, we ask that you please give [place] 2 business days to thoroughly review all your results before contacting the office for clarification. Should we see a critical lab result, you will be contacted sooner.   If You Need Anything After Your Visit  If you have any questions or concerns for your doctor, please call our main line at [phone] If no one answers, please leave a voicemail as directed and we will return your call as soon as possible. Messages left after 4 pm will be answered the following business day.   You may also send [place] a message via MyChart. We typically respond to MyChart messages within 1-2 business days.  For prescription refills, please ask your pharmacy to contact our office. Our fax number is [phone].  If you have an urgent issue when the clinic is closed that cannot wait until the next business day, you can page your doctor at the number below.  Please note that while we do our best to be available for urgent issues outside of office hours, we are not available 24/7.   If you have an urgent issue and are unable to reach [place], you may choose to seek medical care at your doctor's office, retail clinic, urgent care center, or emergency room.  If you have a medical emergency, please immediately call 911 or go to the emergency department. In the event of inclement weather, please call our main line at [phone] for  an update on the status of any delays or closures.  Dermatology Medication Tips: Please keep the boxes that topical medications come in in order to help keep track of the instructions about where and how to use these. Pharmacies typically print the medication instructions only on the boxes and not directly on the medication tubes.   If your medication is too expensive, please contact our office at [phone] or send [place] a message through MyChart.   We are unable to tell what your co-pay for medications will be in advance as this is different depending on your insurance coverage. However, we may be able to find a substitute medication at lower cost or fill out paperwork to get insurance to cover a needed medication.   If a prior authorization is required to get your medication covered by your insurance company, please allow [place] 1-2 business days to complete this process.  Drug prices often vary depending on where the prescription is filled and some pharmacies may offer cheaper prices.  The website www.goodrx.com contains coupons for medications through different pharmacies. The prices here do not account for what the cost may be with help from insurance (it may be cheaper with your insurance), but the website can give you the price if you did not use any insurance.  - You can print the associated coupon and take it with your prescription to the pharmacy.  - You may also stop by our office during regular business hours and pick up a GoodRx coupon card.  - If you need your prescription sent electronically to a different pharmacy, notify our office through [facility] or by phone at [phone]

## 2023-07-12 ENCOUNTER — Telehealth: Payer: Self-pay | Admitting: Dermatology

## 2023-07-12 NOTE — Telephone Encounter (Signed)
Elizabeth Sanchez called in regards to her medication. She states that she was seen on 07/11/2023. Patient state that the pharmacy is needing authorization for this medication "AA gel". Please call patient for more information.[phone].

## 2023-07-12 NOTE — Telephone Encounter (Signed)
Hi Jetta,  We saw this pt yesterday.  Can you call her and explain that this is a compounded cream that does not go through insurance.  Thanks :)

## 2023-07-13 NOTE — Telephone Encounter (Signed)
Spoke with pt. She stated [facility] needed clarification on how she should use the medication. I spoke with Jonny Ruiz with [facility] he stated they needed clarification on if the patient needed to use the med every other morning or every other day. I advised the patient needing instructions on the bottle because she requested to use the med every other morning. John voiced his understanding.

## 2023-08-19 DIAGNOSIS — Z1231 Encounter for screening mammogram for malignant neoplasm of breast: Secondary | ICD-10-CM | POA: Diagnosis not present

## 2023-10-03 DIAGNOSIS — R0981 Nasal congestion: Secondary | ICD-10-CM | POA: Diagnosis not present

## 2023-10-03 DIAGNOSIS — J019 Acute sinusitis, unspecified: Secondary | ICD-10-CM | POA: Diagnosis not present

## 2023-10-03 DIAGNOSIS — Z03818 Encounter for observation for suspected exposure to other biological agents ruled out: Secondary | ICD-10-CM | POA: Diagnosis not present

## 2023-10-03 DIAGNOSIS — R051 Acute cough: Secondary | ICD-10-CM | POA: Diagnosis not present

## 2023-10-11 ENCOUNTER — Ambulatory Visit: Payer: BC Managed Care – PPO | Admitting: Dermatology

## 2023-12-03 DIAGNOSIS — J069 Acute upper respiratory infection, unspecified: Secondary | ICD-10-CM | POA: Diagnosis not present

## 2023-12-13 ENCOUNTER — Ambulatory Visit: Payer: BC Managed Care – PPO | Admitting: Dermatology

## 2023-12-23 DIAGNOSIS — E7800 Pure hypercholesterolemia, unspecified: Secondary | ICD-10-CM | POA: Diagnosis not present

## 2023-12-23 DIAGNOSIS — N951 Menopausal and female climacteric states: Secondary | ICD-10-CM | POA: Diagnosis not present

## 2023-12-23 DIAGNOSIS — Z23 Encounter for immunization: Secondary | ICD-10-CM | POA: Diagnosis not present

## 2023-12-23 DIAGNOSIS — H6121 Impacted cerumen, right ear: Secondary | ICD-10-CM | POA: Diagnosis not present

## 2023-12-23 DIAGNOSIS — Z1211 Encounter for screening for malignant neoplasm of colon: Secondary | ICD-10-CM | POA: Diagnosis not present

## 2023-12-23 DIAGNOSIS — Z124 Encounter for screening for malignant neoplasm of cervix: Secondary | ICD-10-CM | POA: Diagnosis not present

## 2023-12-23 DIAGNOSIS — Z0000 Encounter for general adult medical examination without abnormal findings: Secondary | ICD-10-CM | POA: Diagnosis not present

## 2023-12-26 DIAGNOSIS — Z1211 Encounter for screening for malignant neoplasm of colon: Secondary | ICD-10-CM | POA: Diagnosis not present

## 2024-08-24 DIAGNOSIS — Z1231 Encounter for screening mammogram for malignant neoplasm of breast: Secondary | ICD-10-CM | POA: Diagnosis not present
